# Patient Record
Sex: Male | Born: 2000 | Race: Black or African American | Hispanic: No | Marital: Single | Smoking: Smoker, current status unknown
Health system: Southern US, Community
[De-identification: ages and names within clinical notes are randomized; demographics above are authoritative.]

## PROBLEM LIST (undated history)

## (undated) DIAGNOSIS — J45909 Unspecified asthma, uncomplicated: Secondary | ICD-10-CM

## (undated) DIAGNOSIS — L309 Dermatitis, unspecified: Secondary | ICD-10-CM

## (undated) DIAGNOSIS — T7840XA Allergy, unspecified, initial encounter: Secondary | ICD-10-CM

## (undated) HISTORY — DX: Allergy, unspecified, initial encounter: T78.40XA

## (undated) HISTORY — PX: CIRCUMCISION: SUR203

## (undated) HISTORY — PX: ACNE CYST REMOVAL: SUR1112

---
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
## 2022-10-20 ENCOUNTER — Encounter: Payer: Self-pay | Admitting: *Deleted

## 2023-10-27 ENCOUNTER — Encounter: Payer: Self-pay | Admitting: *Deleted
